# Patient Record
Sex: Female | Born: 1950 | Hispanic: No | Marital: Married | Smoking: Never smoker
Health system: Southern US, Community
[De-identification: ages and names within clinical notes are randomized; demographics above are authoritative.]

## PROBLEM LIST (undated history)

## (undated) DIAGNOSIS — M810 Age-related osteoporosis without current pathological fracture: Secondary | ICD-10-CM

## (undated) DIAGNOSIS — M8580 Other specified disorders of bone density and structure, unspecified site: Secondary | ICD-10-CM

## (undated) HISTORY — PX: RETINAL DETACHMENT SURGERY: SHX105

## (undated) HISTORY — DX: Age-related osteoporosis without current pathological fracture: M81.0

## (undated) HISTORY — PX: CATARACT EXTRACTION: SUR2

## (undated) HISTORY — DX: Other specified disorders of bone density and structure, unspecified site: M85.80

## (undated) HISTORY — PX: TUBAL LIGATION: SHX77

---

## 1998-04-18 ENCOUNTER — Other Ambulatory Visit: Admission: RE | Admit: 1998-04-18 | Discharge: 1998-04-18 | Payer: Self-pay | Admitting: Gynecology

## 1999-04-20 ENCOUNTER — Other Ambulatory Visit: Admission: RE | Admit: 1999-04-20 | Discharge: 1999-04-20 | Payer: Self-pay | Admitting: Gynecology

## 2000-06-06 ENCOUNTER — Other Ambulatory Visit: Admission: RE | Admit: 2000-06-06 | Discharge: 2000-06-06 | Payer: Self-pay | Admitting: Gynecology

## 2001-08-26 ENCOUNTER — Other Ambulatory Visit: Admission: RE | Admit: 2001-08-26 | Discharge: 2001-08-26 | Payer: Self-pay | Admitting: Gynecology

## 2002-11-26 ENCOUNTER — Other Ambulatory Visit: Admission: RE | Admit: 2002-11-26 | Discharge: 2002-11-26 | Payer: Self-pay | Admitting: Gynecology

## 2003-01-07 ENCOUNTER — Other Ambulatory Visit: Admission: RE | Admit: 2003-01-07 | Discharge: 2003-01-07 | Payer: Self-pay | Admitting: Obstetrics and Gynecology

## 2004-02-28 ENCOUNTER — Other Ambulatory Visit: Admission: RE | Admit: 2004-02-28 | Discharge: 2004-02-28 | Payer: Self-pay | Admitting: Gynecology

## 2005-03-09 ENCOUNTER — Other Ambulatory Visit: Admission: RE | Admit: 2005-03-09 | Discharge: 2005-03-09 | Payer: Self-pay | Admitting: Gynecology

## 2005-04-06 ENCOUNTER — Ambulatory Visit: Payer: Self-pay | Admitting: Internal Medicine

## 2005-04-24 ENCOUNTER — Encounter (INDEPENDENT_AMBULATORY_CARE_PROVIDER_SITE_OTHER): Payer: Self-pay | Admitting: Specialist

## 2005-04-24 ENCOUNTER — Ambulatory Visit: Payer: Self-pay | Admitting: Internal Medicine

## 2005-06-26 ENCOUNTER — Ambulatory Visit: Payer: Self-pay | Admitting: Internal Medicine

## 2005-09-11 ENCOUNTER — Ambulatory Visit: Payer: Self-pay | Admitting: Internal Medicine

## 2010-03-22 ENCOUNTER — Encounter (INDEPENDENT_AMBULATORY_CARE_PROVIDER_SITE_OTHER): Payer: Self-pay | Admitting: *Deleted

## 2010-05-05 ENCOUNTER — Encounter: Admission: RE | Admit: 2010-05-05 | Discharge: 2010-05-05 | Payer: Self-pay | Admitting: Gynecology

## 2010-10-17 NOTE — Letter (Signed)
Summary: Colonoscopy Letter  [facility]  [address] [place], [place] [address]   Phone: [phone]  Fax: [phone]      March 22, 2010 MRN: [id number]   Lindsey Patton [address] [place], [place]  [address]   Dear Ms. Peral,   According to your medical record, it is time for you to schedule a Colonoscopy. The American Cancer Society recommends this procedure as a method to detect early colon cancer. Patients with a family history of colon cancer, or a personal history of colon polyps or inflammatory bowel disease are at increased risk.  This letter has beeen generated based on the recommendations made at the time of your procedure. If you feel that in your particular situation this may no longer apply, please contact our office.  Please call our office at [phone] to schedule this appointment or to update your records at your earliest convenience.  Thank you for cooperating with [place] to provide you with the very best care possible.   Sincerely,  Hedwig Morton. Juanda Chance, M.D.  [facility] [phone]

## 2010-12-05 ENCOUNTER — Other Ambulatory Visit: Payer: Self-pay | Admitting: Gynecology

## 2010-12-05 DIAGNOSIS — Z09 Encounter for follow-up examination after completed treatment for conditions other than malignant neoplasm: Secondary | ICD-10-CM

## 2010-12-25 ENCOUNTER — Ambulatory Visit
Admission: RE | Admit: 2010-12-25 | Discharge: 2010-12-25 | Disposition: A | Discharge status: Home / Self Care | Payer: BC Managed Care – PPO | Source: Ambulatory Visit | Attending: Gynecology | Admitting: Gynecology

## 2010-12-25 DIAGNOSIS — Z09 Encounter for follow-up examination after completed treatment for conditions other than malignant neoplasm: Secondary | ICD-10-CM

## 2011-02-26 ENCOUNTER — Encounter: Payer: Self-pay | Admitting: Internal Medicine

## 2011-02-26 NOTE — Progress Notes (Signed)
Patient has changed GI care to Dr Jennye Boroughs at [facility].

## 2011-08-02 ENCOUNTER — Other Ambulatory Visit: Payer: Self-pay | Admitting: Gynecology

## 2011-08-02 DIAGNOSIS — Z1231 Encounter for screening mammogram for malignant neoplasm of breast: Secondary | ICD-10-CM

## 2011-08-24 ENCOUNTER — Ambulatory Visit
Admission: RE | Admit: 2011-08-24 | Discharge: 2011-08-24 | Disposition: A | Discharge status: Home / Self Care | Payer: BC Managed Care – PPO | Source: Ambulatory Visit | Attending: Gynecology | Admitting: Gynecology

## 2011-08-24 DIAGNOSIS — Z1231 Encounter for screening mammogram for malignant neoplasm of breast: Secondary | ICD-10-CM

## 2011-10-12 ENCOUNTER — Other Ambulatory Visit: Payer: Self-pay | Admitting: Gynecology

## 2011-10-12 DIAGNOSIS — R928 Other abnormal and inconclusive findings on diagnostic imaging of breast: Secondary | ICD-10-CM

## 2011-10-22 ENCOUNTER — Ambulatory Visit
Admission: RE | Admit: 2011-10-22 | Discharge: 2011-10-22 | Disposition: A | Discharge status: Home / Self Care | Payer: BC Managed Care – PPO | Source: Ambulatory Visit | Attending: Gynecology | Admitting: Gynecology

## 2011-10-22 DIAGNOSIS — R928 Other abnormal and inconclusive findings on diagnostic imaging of breast: Secondary | ICD-10-CM

## 2013-01-29 ENCOUNTER — Other Ambulatory Visit: Payer: Self-pay

## 2013-01-29 DIAGNOSIS — Z1231 Encounter for screening mammogram for malignant neoplasm of breast: Secondary | ICD-10-CM

## 2013-03-04 ENCOUNTER — Ambulatory Visit
Admission: RE | Admit: 2013-03-04 | Discharge: 2013-03-04 | Disposition: A | Discharge status: Home / Self Care | Payer: BC Managed Care – PPO | Source: Ambulatory Visit

## 2013-03-04 DIAGNOSIS — Z1231 Encounter for screening mammogram for malignant neoplasm of breast: Secondary | ICD-10-CM

## 2014-03-03 ENCOUNTER — Other Ambulatory Visit: Payer: Self-pay

## 2014-03-03 DIAGNOSIS — Z1231 Encounter for screening mammogram for malignant neoplasm of breast: Secondary | ICD-10-CM

## 2014-03-22 ENCOUNTER — Encounter (INDEPENDENT_AMBULATORY_CARE_PROVIDER_SITE_OTHER): Payer: Self-pay

## 2014-03-22 ENCOUNTER — Ambulatory Visit
Admission: RE | Admit: 2014-03-22 | Discharge: 2014-03-22 | Disposition: A | Discharge status: Home / Self Care | Payer: BC Managed Care – PPO | Source: Ambulatory Visit

## 2014-03-22 DIAGNOSIS — Z1231 Encounter for screening mammogram for malignant neoplasm of breast: Secondary | ICD-10-CM

## 2014-05-31 ENCOUNTER — Other Ambulatory Visit: Payer: Self-pay | Admitting: Gynecology

## 2014-06-01 LAB — CYTOLOGY - PAP

## 2015-06-12 IMAGING — MG MM DIGITAL SCREENING BILAT W/ CAD
5 series · 5 of 5 positions shown · non-contrast
Comparison: Previous exam(s).

CLINICAL DATA: Screening.

EXAM:
DIGITAL SCREENING BILATERAL MAMMOGRAM WITH CAD

[R CC]
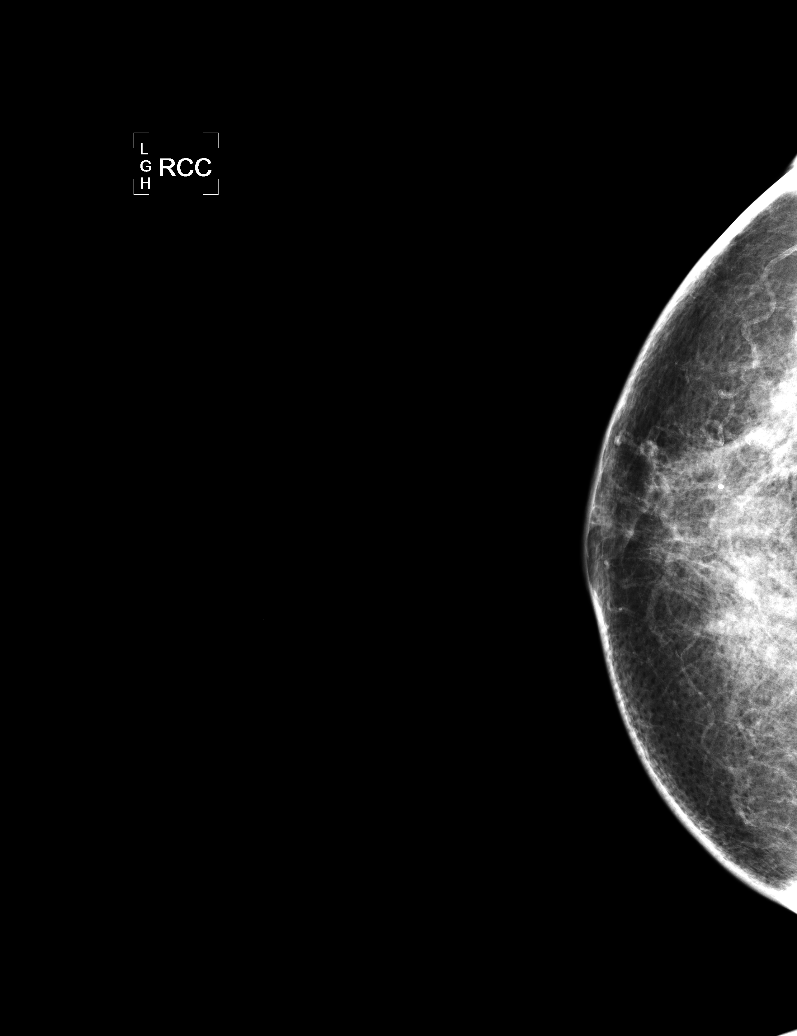

[L CC]
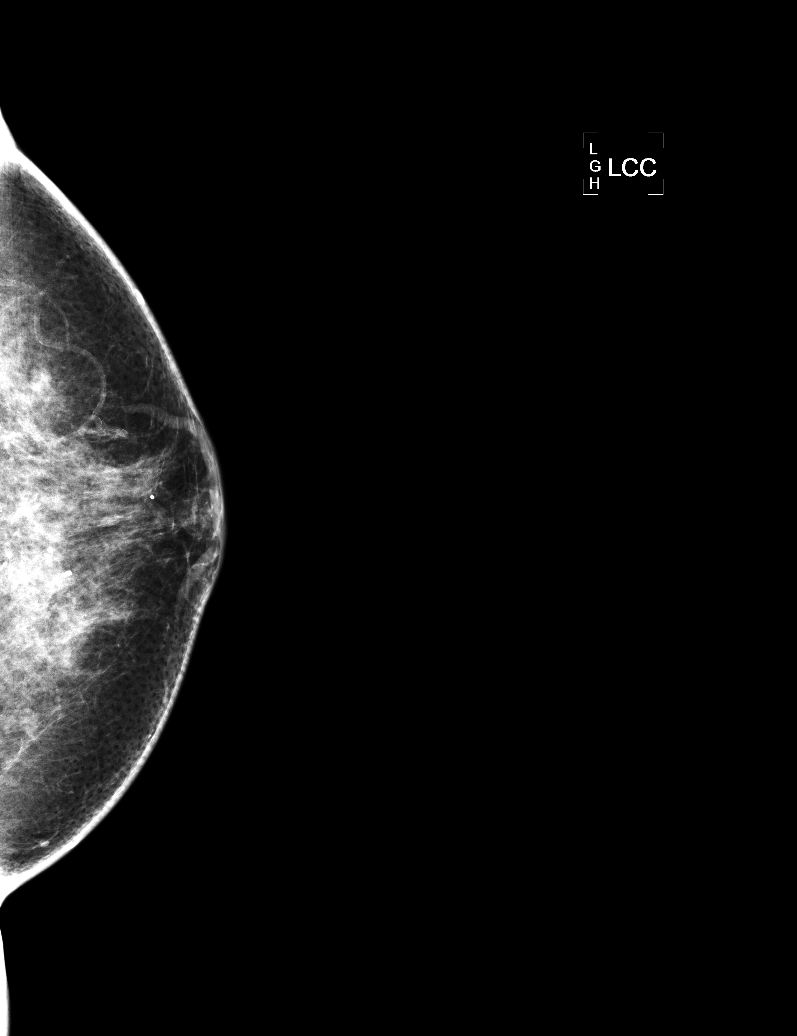

[L MLO]
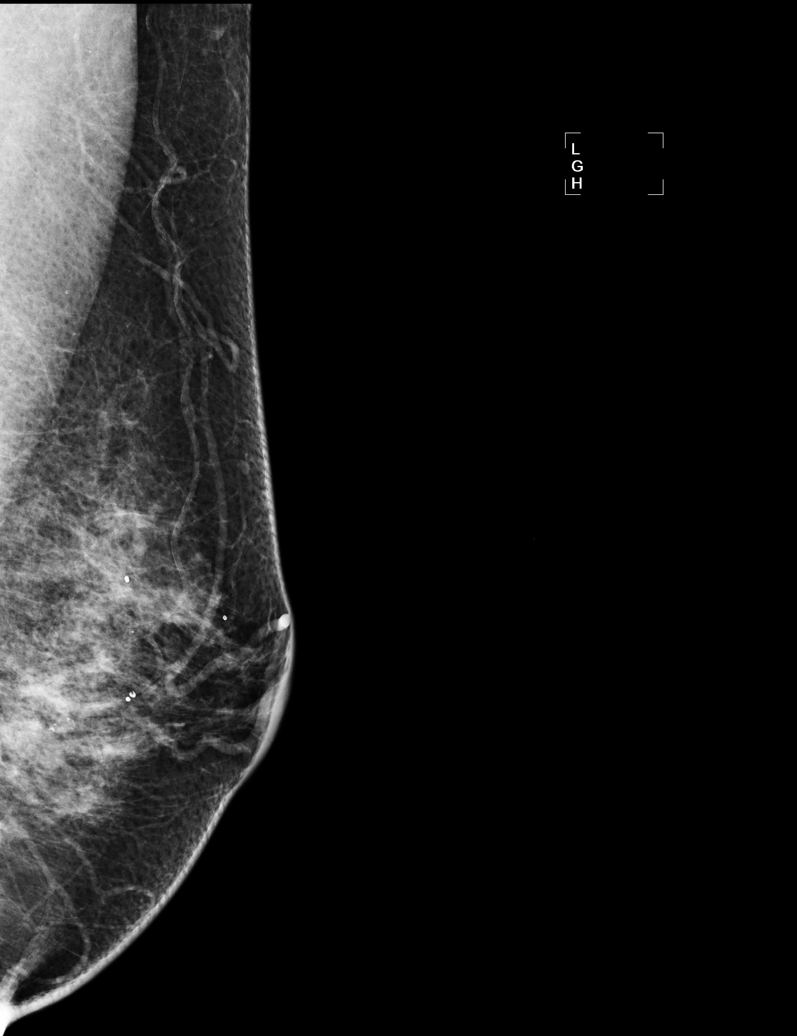

[R MLO (1 of 2)]
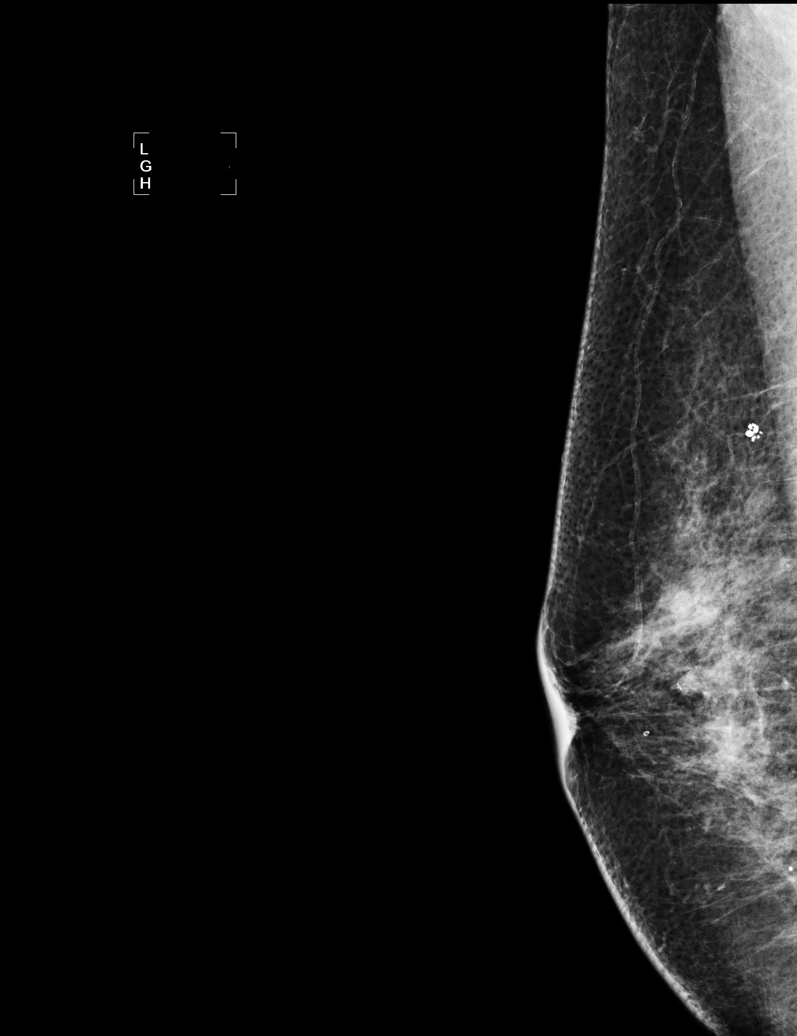

[R MLO (2 of 2)]
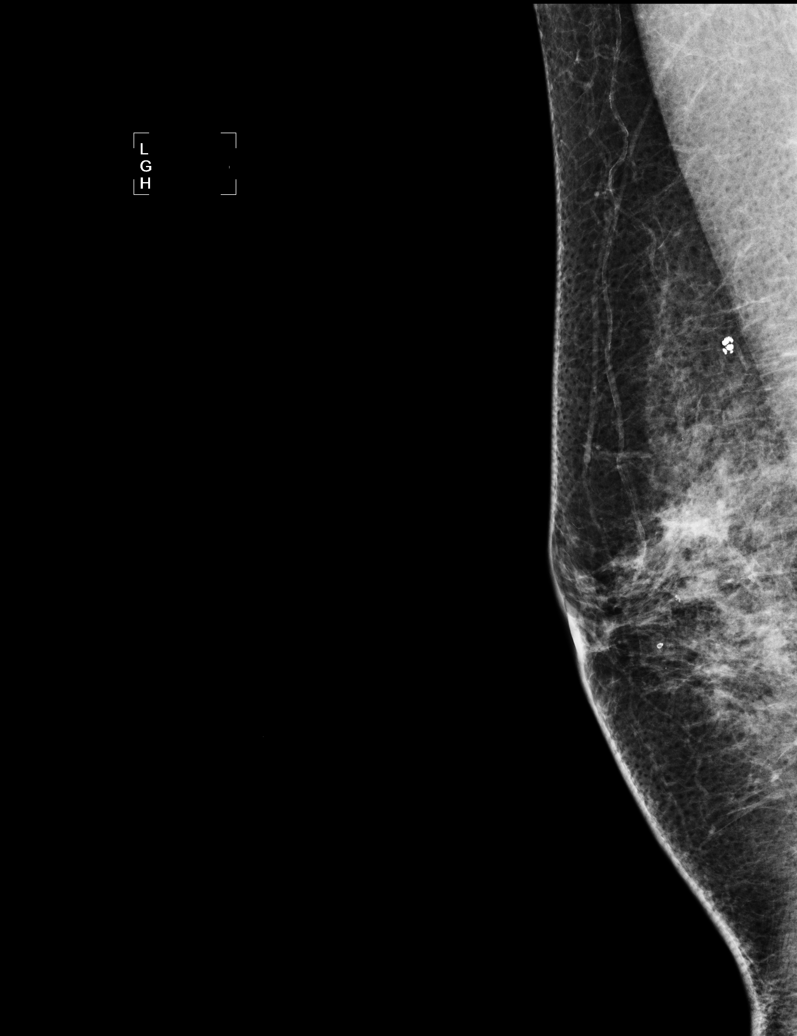

[5 of 5 positions shown; findings below may reference images not displayed]

ACR Breast Density Category c: The breast tissue is heterogeneously
dense, which may obscure small masses.
FINDINGS: There are no findings suspicious for malignancy. Images were
processed with CAD.
IMPRESSION: No mammographic evidence of malignancy. A result letter of this
screening mammogram will be mailed directly to the patient.

RECOMMENDATION:
Screening mammogram in one year. (Code:YJ-2-FEZ)

BI-RADS CATEGORY  1: Negative.

## 2019-09-21 DIAGNOSIS — Z1231 Encounter for screening mammogram for malignant neoplasm of breast: Secondary | ICD-10-CM | POA: Diagnosis not present

## 2020-04-05 DIAGNOSIS — D2239 Melanocytic nevi of other parts of face: Secondary | ICD-10-CM | POA: Diagnosis not present

## 2020-04-05 DIAGNOSIS — D225 Melanocytic nevi of trunk: Secondary | ICD-10-CM | POA: Diagnosis not present

## 2020-04-05 DIAGNOSIS — D1801 Hemangioma of skin and subcutaneous tissue: Secondary | ICD-10-CM | POA: Diagnosis not present

## 2020-04-05 DIAGNOSIS — D485 Neoplasm of uncertain behavior of skin: Secondary | ICD-10-CM | POA: Diagnosis not present

## 2020-04-05 DIAGNOSIS — L578 Other skin changes due to chronic exposure to nonionizing radiation: Secondary | ICD-10-CM | POA: Diagnosis not present

## 2020-04-19 DIAGNOSIS — L570 Actinic keratosis: Secondary | ICD-10-CM | POA: Diagnosis not present

## 2020-06-03 DIAGNOSIS — Z6824 Body mass index (BMI) 24.0-24.9, adult: Secondary | ICD-10-CM | POA: Diagnosis not present

## 2020-06-03 DIAGNOSIS — M25511 Pain in right shoulder: Secondary | ICD-10-CM | POA: Diagnosis not present

## 2020-06-10 DIAGNOSIS — M7551 Bursitis of right shoulder: Secondary | ICD-10-CM | POA: Diagnosis not present

## 2020-06-10 DIAGNOSIS — Z6824 Body mass index (BMI) 24.0-24.9, adult: Secondary | ICD-10-CM | POA: Diagnosis not present

## 2020-06-10 DIAGNOSIS — M75101 Unspecified rotator cuff tear or rupture of right shoulder, not specified as traumatic: Secondary | ICD-10-CM | POA: Diagnosis not present

## 2020-06-29 DIAGNOSIS — E559 Vitamin D deficiency, unspecified: Secondary | ICD-10-CM | POA: Diagnosis not present

## 2020-06-29 DIAGNOSIS — M8580 Other specified disorders of bone density and structure, unspecified site: Secondary | ICD-10-CM | POA: Diagnosis not present

## 2020-06-29 DIAGNOSIS — Z6823 Body mass index (BMI) 23.0-23.9, adult: Secondary | ICD-10-CM | POA: Diagnosis not present

## 2020-06-29 DIAGNOSIS — Z9181 History of falling: Secondary | ICD-10-CM | POA: Diagnosis not present

## 2020-06-29 DIAGNOSIS — Z23 Encounter for immunization: Secondary | ICD-10-CM | POA: Diagnosis not present

## 2020-06-29 DIAGNOSIS — Z0000 Encounter for general adult medical examination without abnormal findings: Secondary | ICD-10-CM | POA: Diagnosis not present

## 2020-06-29 DIAGNOSIS — E789 Disorder of lipoprotein metabolism, unspecified: Secondary | ICD-10-CM | POA: Diagnosis not present

## 2020-06-29 DIAGNOSIS — Z79899 Other long term (current) drug therapy: Secondary | ICD-10-CM | POA: Diagnosis not present

## 2020-06-29 DIAGNOSIS — Z1331 Encounter for screening for depression: Secondary | ICD-10-CM | POA: Diagnosis not present

## 2020-06-30 DIAGNOSIS — Z23 Encounter for immunization: Secondary | ICD-10-CM | POA: Diagnosis not present

## 2020-10-12 DIAGNOSIS — R091 Pleurisy: Secondary | ICD-10-CM | POA: Diagnosis not present

## 2020-10-12 DIAGNOSIS — Z20828 Contact with and (suspected) exposure to other viral communicable diseases: Secondary | ICD-10-CM | POA: Diagnosis not present

## 2020-11-01 DIAGNOSIS — Z1231 Encounter for screening mammogram for malignant neoplasm of breast: Secondary | ICD-10-CM | POA: Diagnosis not present

## 2021-01-17 DIAGNOSIS — R509 Fever, unspecified: Secondary | ICD-10-CM | POA: Diagnosis not present

## 2021-01-18 DIAGNOSIS — J329 Chronic sinusitis, unspecified: Secondary | ICD-10-CM | POA: Diagnosis not present

## 2021-01-18 DIAGNOSIS — J40 Bronchitis, not specified as acute or chronic: Secondary | ICD-10-CM | POA: Diagnosis not present

## 2021-04-27 DIAGNOSIS — H52221 Regular astigmatism, right eye: Secondary | ICD-10-CM | POA: Diagnosis not present

## 2021-04-27 DIAGNOSIS — Z9841 Cataract extraction status, right eye: Secondary | ICD-10-CM | POA: Diagnosis not present

## 2021-04-27 DIAGNOSIS — H5211 Myopia, right eye: Secondary | ICD-10-CM | POA: Diagnosis not present

## 2021-04-27 DIAGNOSIS — H25811 Combined forms of age-related cataract, right eye: Secondary | ICD-10-CM | POA: Diagnosis not present

## 2021-04-27 DIAGNOSIS — Z961 Presence of intraocular lens: Secondary | ICD-10-CM | POA: Diagnosis not present

## 2021-04-27 DIAGNOSIS — H2511 Age-related nuclear cataract, right eye: Secondary | ICD-10-CM | POA: Diagnosis not present

## 2021-05-12 DIAGNOSIS — Z111 Encounter for screening for respiratory tuberculosis: Secondary | ICD-10-CM | POA: Diagnosis not present

## 2021-07-11 DIAGNOSIS — M25562 Pain in left knee: Secondary | ICD-10-CM | POA: Diagnosis not present

## 2021-07-11 DIAGNOSIS — M25462 Effusion, left knee: Secondary | ICD-10-CM | POA: Diagnosis not present

## 2021-08-04 DIAGNOSIS — M25562 Pain in left knee: Secondary | ICD-10-CM | POA: Diagnosis not present

## 2021-08-09 DIAGNOSIS — M25562 Pain in left knee: Secondary | ICD-10-CM | POA: Diagnosis not present

## 2021-08-09 DIAGNOSIS — M25462 Effusion, left knee: Secondary | ICD-10-CM | POA: Diagnosis not present

## 2021-09-15 DIAGNOSIS — E789 Disorder of lipoprotein metabolism, unspecified: Secondary | ICD-10-CM | POA: Diagnosis not present

## 2021-09-15 DIAGNOSIS — Z1331 Encounter for screening for depression: Secondary | ICD-10-CM | POA: Diagnosis not present

## 2021-09-15 DIAGNOSIS — Z79899 Other long term (current) drug therapy: Secondary | ICD-10-CM | POA: Diagnosis not present

## 2021-09-15 DIAGNOSIS — Z6823 Body mass index (BMI) 23.0-23.9, adult: Secondary | ICD-10-CM | POA: Diagnosis not present

## 2021-09-15 DIAGNOSIS — Z0000 Encounter for general adult medical examination without abnormal findings: Secondary | ICD-10-CM | POA: Diagnosis not present

## 2021-09-15 DIAGNOSIS — E559 Vitamin D deficiency, unspecified: Secondary | ICD-10-CM | POA: Diagnosis not present

## 2021-11-03 DIAGNOSIS — Z6824 Body mass index (BMI) 24.0-24.9, adult: Secondary | ICD-10-CM | POA: Diagnosis not present

## 2021-11-03 DIAGNOSIS — N6002 Solitary cyst of left breast: Secondary | ICD-10-CM | POA: Diagnosis not present

## 2021-11-09 DIAGNOSIS — N6012 Diffuse cystic mastopathy of left breast: Secondary | ICD-10-CM | POA: Diagnosis not present

## 2021-11-09 DIAGNOSIS — N6002 Solitary cyst of left breast: Secondary | ICD-10-CM | POA: Diagnosis not present

## 2021-11-09 DIAGNOSIS — R922 Inconclusive mammogram: Secondary | ICD-10-CM | POA: Diagnosis not present

## 2021-11-15 ENCOUNTER — Encounter: Payer: Self-pay | Admitting: Cardiology

## 2021-11-15 ENCOUNTER — Ambulatory Visit: Payer: Medicare PPO | Admitting: Cardiology

## 2021-11-15 ENCOUNTER — Other Ambulatory Visit: Payer: Self-pay

## 2021-11-15 DIAGNOSIS — Z8249 Family history of ischemic heart disease and other diseases of the circulatory system: Secondary | ICD-10-CM | POA: Diagnosis not present

## 2021-11-15 DIAGNOSIS — E785 Hyperlipidemia, unspecified: Secondary | ICD-10-CM

## 2021-11-15 DIAGNOSIS — R0609 Other forms of dyspnea: Secondary | ICD-10-CM | POA: Diagnosis not present

## 2021-11-15 HISTORY — DX: Other forms of dyspnea: R06.09

## 2021-11-15 HISTORY — DX: Hyperlipidemia, unspecified: E78.5

## 2021-11-15 HISTORY — DX: Family history of ischemic heart disease and other diseases of the circulatory system: Z82.49

## 2021-11-15 NOTE — Addendum Note (Signed)
Addended by: Edwyna Shell I on: 11/15/2021 03:45 PM ? ? Modules accepted: Orders ? ?

## 2021-11-15 NOTE — Progress Notes (Signed)
? ?Cardiology Consultation:   ? ?Date:  11/15/2021  ? ?ID:  Lindsey Patton, DOB 1951/05/06, MRN [id number] ? ?PCP:  Ronita Hipps, MD  ?Cardiologist:  Jenne Campus, MD  ? ?Referring MD: No ref. provider found  ? ?Chief Complaint  ?Patient presents with  ? Establish Care  ? ? ?History of Present Illness:   ? ?Lindsey Patton is a 71 y.o. female who is being seen today for the evaluation of I would like to make sure I am fine from cardiac standpoint reviewed.  At the request of No ref. provider found.  Her past medical history significant for dyslipidemia treated with Zetia only, dyspnea on exertion.  Overall she is doing quite well however her mother is my patient and she does have a lot of cardiac conditions Lindsey Patton would like to be established as a patient I will make sure everything is fine with her.  She denies having any typical cardiac symptoms namely she does not have chest pain tightness squeezing pressure burning chest no palpitations dizziness swelling of lower extremities.  He is trying to be active she goes to gym 3 times a week she does water aerobic and she is enjoying this tremendously.  There is no swelling of lower extremities.  She tells me that she snores a lot but no stop breathing at night.  She does have history of dyslipidemia she was offered statin however she refused to take it because of her mother having some difficulty with the medication.  She is on Zetia only her LDL right now is still slightly elevated.  I 135 ?She never smoke she is not on any special diet ? ?History reviewed. No pertinent past medical history. ? ?Past Surgical History:  ?Procedure Laterality Date  ? CATARACT EXTRACTION Bilateral   ? RETINAL DETACHMENT SURGERY Bilateral   ? TUBAL LIGATION    ? ? ?Current Medications: ?Current Meds  ?Medication Sig  ? calcium citrate (CALCITRATE - DOSED IN MG ELEMENTAL CALCIUM) 950 (200 Ca) MG tablet Take 200 mg of elemental calcium by mouth daily.  ? Cholecalciferol (VITAMIN D3 PO)  Take 30 mcg by mouth daily.  ? ezetimibe (ZETIA) 10 MG tablet Take 10 mg by mouth daily.  ? magnesium 30 MG tablet Take 30 mg by mouth daily.  ? Omega-3 Fatty Acids (OMEGA-3 FISH OIL PO) Take 1 capsule by mouth daily.  ?  ? ?Allergies:   Patient has no known allergies.  ? ?Social History  ? ?Socioeconomic History  ? Marital status: Married  ?  Spouse name: Not on file  ? Number of children: Not on file  ? Years of education: Not on file  ? Highest education level: Not on file  ?Occupational History  ? Not on file  ?Tobacco Use  ? Smoking status: Never  ? Smokeless tobacco: Never  ?Substance and Sexual Activity  ? Alcohol use: Never  ? Drug use: Never  ? Sexual activity: Not Currently  ?Other Topics Concern  ? Not on file  ?Social History Narrative  ? Not on file  ? ?Social Determinants of Health  ? ?Financial Resource Strain: Not on file  ?Food Insecurity: Not on file  ?Transportation Needs: Not on file  ?Physical Activity: Not on file  ?Stress: Not on file  ?Social Connections: Not on file  ?  ? ?Family History: ?The patient's family history includes Heart failure in her maternal grandfather and mother. ?ROS:   ?Please see the history of present illness.    ?  All 14 point review of systems negative except as described per history of present illness. ? ?EKGs/Labs/Other Studies Reviewed:   ? ?The following studies were reviewed today: ? ? ?EKG:  EKG is  ordered today.  The ekg ordered today demonstrates normal sinus rhythm normal P interval normal QS complex duration fulgent no ST segment changes ? ?Recent Labs: ?No results found for requested labs within last 8760 hours.  ?Recent Lipid Panel ?No results found for: CHOL, TRIG, HDL, CHOLHDL, VLDL, LDLCALC, LDLDIRECT ? ?Physical Exam:   ? ?VS:  BP 128/80 (BP Location: Right Arm, Patient Position: Sitting)   Pulse 65   Ht 5\' 6"  (1.676 m)   Wt 152 lb 3.2 oz (69 kg)   SpO2 95%   BMI 24.57 kg/m?    ? ?Wt Readings from Last 3 Encounters:  ?11/15/21 152 lb 3.2 oz (69 kg)   ?  ? ?GEN:  Well nourished, well developed in no acute distress ?HEENT: Normal ?NECK: No JVD; No carotid bruits ?LYMPHATICS: No lymphadenopathy ?CARDIAC: RRR, no murmurs, no rubs, no gallops ?RESPIRATORY:  Clear to auscultation without rales, wheezing or rhonchi  ?ABDOMEN: Soft, non-tender, non-distended ?MUSCULOSKELETAL:  No edema; No deformity  ?SKIN: Warm and dry ?NEUROLOGIC:  Alert and oriented x 3 ?PSYCHIATRIC:  Normal affect  ? ?ASSESSMENT:   ? ?1. Dyspnea on exertion   ?2. Dyslipidemia   ?3. Family history of coronary artery disease   ? ?PLAN:   ? ?In order of problems listed above: ? ?Cardiovascular evaluation for this lady with family history of premature coronary artery disease and advanced coronary artery disease.  Overall she is doing very well clinically.  I think the key will be to assess her risk factors and properly modify dose.  I do not think we need to do any ischemia work-up she is able to exercise on the regular basis with no difficulties.  I did calculate her 10 years predicted risk for having heart problem which is 8.8.  This is intermediate.  She is already taking Zetia not on statin.  I recommended to go to calcium score to assess better her risk factors for future based on that we will decide how aggressive we need to be with management of her cholesterol also at that time we will decide if you need to be on aspirin.  We did talk in length about healthy lifestyle need to exercise on the regular basis good diet and I discussed with her basic of Mediterranean diet.  Overall I think she is doing very well.  And she will do well.  Calcium score will be done I will see her back 6 months ? ? ?Medication Adjustments/Labs and Tests Ordered: ?Current medicines are reviewed at length with the patient today.  Concerns regarding medicines are outlined above.  ?No orders of the defined types were placed in this encounter. ? ?No orders of the defined types were placed in this  encounter. ? ? ?Signed, ?Park Liter, MD, [facility]. ?11/15/2021 3:28 PM    ?[place] ? ?

## 2021-11-15 NOTE — Patient Instructions (Signed)
Medication Instructions:  ?Your physician recommends that you continue on your current medications as directed. Please refer to the Current Medication list given to you today. ? ?*If you need a refill on your cardiac medications before your next appointment, please call your pharmacy* ? ? ?Lab Work: ?None ?If you have labs (blood work) drawn today and your tests are completely normal, you will receive your results only by: ?MyChart Message (if you have MyChart) OR ?A paper copy in the mail ?If you have any lab test that is abnormal or we need to change your treatment, we will call you to review the results. ? ? ?Testing/Procedures: ?Marland KitchenWe will order CT coronary calcium score. ?It will cost $99.00 and is not covered by insurance.  ?Please call [phone] to schedule.   ?[facility]  ?[address]  ?[place], [place] [address]  ? ? ?Follow-Up: ?At [facility], you and your health needs are our priority.  As part of our continuing mission to provide you with exceptional heart care, we have created designated Provider Care Teams.  These Care Teams include your primary Cardiologist (physician) and Advanced Practice Providers (APPs -  Physician Assistants and Nurse Practitioners) who all work together to provide you with the care you need, when you need it. ? ?We recommend signing up for the patient portal called "MyChart".  Sign up information is provided on this After Visit Summary.  MyChart is used to connect with patients for Virtual Visits (Telemedicine).  Patients are able to view lab/test results, encounter notes, upcoming appointments, etc.  Non-urgent messages can be sent to your provider as well.   ?To learn more about what you can do with MyChart, go to [email or web address].   ? ?Your next appointment:   ?6 month(s) ? ?The format for your next appointment:   ?In Person ? ?Provider:   ?Gypsy Balsam, MD  ? ? ?Other Instructions ?None ? ?

## 2021-11-20 DIAGNOSIS — N6011 Diffuse cystic mastopathy of right breast: Secondary | ICD-10-CM | POA: Diagnosis not present

## 2021-11-20 DIAGNOSIS — N6002 Solitary cyst of left breast: Secondary | ICD-10-CM | POA: Diagnosis not present

## 2021-11-20 DIAGNOSIS — R928 Other abnormal and inconclusive findings on diagnostic imaging of breast: Secondary | ICD-10-CM | POA: Diagnosis not present

## 2021-11-21 ENCOUNTER — Other Ambulatory Visit: Payer: Self-pay

## 2021-11-21 ENCOUNTER — Ambulatory Visit (HOSPITAL_COMMUNITY)
Admission: RE | Admit: 2021-11-21 | Discharge: 2021-11-21 | Disposition: A | Discharge status: Home / Self Care | Payer: Self-pay | Source: Ambulatory Visit | Attending: Cardiology | Admitting: Cardiology

## 2021-11-21 DIAGNOSIS — R0609 Other forms of dyspnea: Secondary | ICD-10-CM | POA: Insufficient documentation

## 2021-11-21 DIAGNOSIS — E785 Hyperlipidemia, unspecified: Secondary | ICD-10-CM | POA: Insufficient documentation

## 2021-11-21 DIAGNOSIS — Z8249 Family history of ischemic heart disease and other diseases of the circulatory system: Secondary | ICD-10-CM | POA: Insufficient documentation

## 2021-11-27 DIAGNOSIS — S61219A Laceration without foreign body of unspecified finger without damage to nail, initial encounter: Secondary | ICD-10-CM | POA: Diagnosis not present

## 2022-01-29 DIAGNOSIS — J302 Other seasonal allergic rhinitis: Secondary | ICD-10-CM | POA: Diagnosis not present

## 2022-01-29 DIAGNOSIS — H6121 Impacted cerumen, right ear: Secondary | ICD-10-CM | POA: Diagnosis not present

## 2022-01-29 DIAGNOSIS — Z6824 Body mass index (BMI) 24.0-24.9, adult: Secondary | ICD-10-CM | POA: Diagnosis not present

## 2022-05-28 ENCOUNTER — Ambulatory Visit: Payer: Medicare PPO | Attending: Cardiology | Admitting: Cardiology

## 2022-05-28 ENCOUNTER — Encounter: Payer: Self-pay | Admitting: Cardiology

## 2022-05-28 VITALS — BP 118/72 | HR 71 | Ht 66.0 in | Wt 152.4 lb

## 2022-05-28 DIAGNOSIS — E785 Hyperlipidemia, unspecified: Secondary | ICD-10-CM | POA: Diagnosis not present

## 2022-05-28 DIAGNOSIS — R0609 Other forms of dyspnea: Secondary | ICD-10-CM | POA: Diagnosis not present

## 2022-05-28 DIAGNOSIS — Z8249 Family history of ischemic heart disease and other diseases of the circulatory system: Secondary | ICD-10-CM | POA: Diagnosis not present

## 2022-05-28 NOTE — Progress Notes (Signed)
Cardiology Office Note:    Date:  05/28/2022   ID:  Lindsey Patton, DOB 03-Nov-1950, MRN [id number]  PCP:  Marylen Ponto, MD  Cardiologist:  Gypsy Balsam, MD    Referring MD: Marylen Ponto, MD   Chief Complaint  Patient presents with   Follow-up  Doing very well  History of Present Illness:    Lindsey Patton is a 71 y.o. female with past medical history significant for dyslipidemia, she is only on Zetia, she is afraid to take statin, dyspnea exertion, family history of coronary artery disease. She is in my office today for follow-up.  Overall she is doing well.  Denies of any chest pain tightness squeezing pressure burning chest no palpitation dizziness swelling of lower extremities.  She did have a calcium score which came 0.  There was some nodule on the pulmonary portion of the test however radiologist is not concerned no follow-up needed.  Obviously she is very pleased with the results of her test.  History reviewed. No pertinent past medical history.  Past Surgical History:  Procedure Laterality Date   CATARACT EXTRACTION Bilateral    RETINAL DETACHMENT SURGERY Bilateral    TUBAL LIGATION      Current Medications: Current Meds  Medication Sig   calcium citrate (CALCITRATE - DOSED IN MG ELEMENTAL CALCIUM) 950 (200 Ca) MG tablet Take 200 mg of elemental calcium by mouth daily.   Cholecalciferol (VITAMIN D3 PO) Take 30 mcg by mouth daily.   ezetimibe (ZETIA) 10 MG tablet Take 10 mg by mouth daily.   magnesium 30 MG tablet Take 30 mg by mouth daily.   Omega-3 Fatty Acids (OMEGA-3 FISH OIL PO) Take 1 capsule by mouth daily.     Allergies:   Patient has no known allergies.   Social History   Socioeconomic History   Marital status: Married    Spouse name: Not on file   Number of children: Not on file   Years of education: Not on file   Highest education level: Not on file  Occupational History   Not on file  Tobacco Use   Smoking status: Never   Smokeless  tobacco: Never  Substance and Sexual Activity   Alcohol use: Never   Drug use: Never   Sexual activity: Not Currently  Other Topics Concern   Not on file  Social History Narrative   Not on file   Social Determinants of Health   Financial Resource Strain: Not on file  Food Insecurity: Not on file  Transportation Needs: Not on file  Physical Activity: Not on file  Stress: Not on file  Social Connections: Not on file     Family History: The patient's family history includes Heart failure in her maternal grandfather and mother. ROS:   Please see the history of present illness.    All 14 point review of systems negative except as described per history of present illness  EKGs/Labs/Other Studies Reviewed:      Recent Labs: No results found for requested labs within last 365 days.  Recent Lipid Panel No results found for: "CHOL", "TRIG", "HDL", "CHOLHDL", "VLDL", "LDLCALC", "LDLDIRECT"  Physical Exam:    VS:  BP 118/72 (BP Location: Left Arm, Patient Position: Sitting)   Pulse 71   Ht 5\' 6"  (1.676 m)   Wt 152 lb 6.4 oz (69.1 kg)   SpO2 95%   BMI 24.60 kg/m     Wt Readings from Last 3 Encounters:  05/28/22 152 lb 6.4  oz (69.1 kg)  11/15/21 152 lb 3.2 oz (69 kg)     GEN:  Well nourished, well developed in no acute distress HEENT: Normal NECK: No JVD; No carotid bruits LYMPHATICS: No lymphadenopathy CARDIAC: RRR, no murmurs, no rubs, no gallops RESPIRATORY:  Clear to auscultation without rales, wheezing or rhonchi  ABDOMEN: Soft, non-tender, non-distended MUSCULOSKELETAL:  No edema; No deformity  SKIN: Warm and dry LOWER EXTREMITIES: no swelling NEUROLOGIC:  Alert and oriented x 3 PSYCHIATRIC:  Normal affect   ASSESSMENT:    1. Dyslipidemia   2. Family history of coronary artery disease   3. Dyspnea on exertion    PLAN:    In order of problems listed above:  Dyslipidemia.  She is intermediate risk category she is already on Zetia.  Calcium score 0.  No  need to initiate statin. Family history of coronary artery disease.  We discussed at length what to do with the situation we discussed need to exercise on the regular basis which she already does as well as good diet I discussed basic of Mediterranean diet. Dyspnea exertion: Doing well continue present management   Medication Adjustments/Labs and Tests Ordered: Current medicines are reviewed at length with the patient today.  Concerns regarding medicines are outlined above.  No orders of the defined types were placed in this encounter.  Medication changes: No orders of the defined types were placed in this encounter.   Signed, Georgeanna Lea, MD, [facility] 05/28/2022 1:24 PM    [facility]

## 2022-05-28 NOTE — Patient Instructions (Signed)

## 2022-08-21 DIAGNOSIS — H5201 Hypermetropia, right eye: Secondary | ICD-10-CM | POA: Diagnosis not present

## 2022-08-21 DIAGNOSIS — H5212 Myopia, left eye: Secondary | ICD-10-CM | POA: Diagnosis not present

## 2022-08-21 DIAGNOSIS — Z961 Presence of intraocular lens: Secondary | ICD-10-CM | POA: Diagnosis not present

## 2022-08-21 DIAGNOSIS — H26491 Other secondary cataract, right eye: Secondary | ICD-10-CM | POA: Diagnosis not present

## 2022-08-21 DIAGNOSIS — H52223 Regular astigmatism, bilateral: Secondary | ICD-10-CM | POA: Diagnosis not present

## 2022-08-21 DIAGNOSIS — Z9842 Cataract extraction status, left eye: Secondary | ICD-10-CM | POA: Diagnosis not present

## 2022-10-31 DIAGNOSIS — Z23 Encounter for immunization: Secondary | ICD-10-CM | POA: Diagnosis not present

## 2022-10-31 DIAGNOSIS — E789 Disorder of lipoprotein metabolism, unspecified: Secondary | ICD-10-CM | POA: Diagnosis not present

## 2022-10-31 DIAGNOSIS — Z0000 Encounter for general adult medical examination without abnormal findings: Secondary | ICD-10-CM | POA: Diagnosis not present

## 2022-10-31 DIAGNOSIS — Z79899 Other long term (current) drug therapy: Secondary | ICD-10-CM | POA: Diagnosis not present

## 2022-10-31 DIAGNOSIS — M8580 Other specified disorders of bone density and structure, unspecified site: Secondary | ICD-10-CM | POA: Diagnosis not present

## 2022-10-31 DIAGNOSIS — Z6824 Body mass index (BMI) 24.0-24.9, adult: Secondary | ICD-10-CM | POA: Diagnosis not present

## 2022-10-31 DIAGNOSIS — Z1331 Encounter for screening for depression: Secondary | ICD-10-CM | POA: Diagnosis not present

## 2022-10-31 DIAGNOSIS — E559 Vitamin D deficiency, unspecified: Secondary | ICD-10-CM | POA: Diagnosis not present

## 2022-10-31 DIAGNOSIS — M8589 Other specified disorders of bone density and structure, multiple sites: Secondary | ICD-10-CM | POA: Diagnosis not present

## 2022-11-16 DIAGNOSIS — N6323 Unspecified lump in the left breast, lower outer quadrant: Secondary | ICD-10-CM | POA: Diagnosis not present

## 2022-11-16 DIAGNOSIS — N6002 Solitary cyst of left breast: Secondary | ICD-10-CM | POA: Diagnosis not present

## 2023-01-07 DIAGNOSIS — M545 Low back pain, unspecified: Secondary | ICD-10-CM | POA: Diagnosis not present

## 2023-01-07 DIAGNOSIS — Z6824 Body mass index (BMI) 24.0-24.9, adult: Secondary | ICD-10-CM | POA: Diagnosis not present

## 2023-03-20 DIAGNOSIS — M5459 Other low back pain: Secondary | ICD-10-CM | POA: Diagnosis not present

## 2023-03-27 DIAGNOSIS — M5459 Other low back pain: Secondary | ICD-10-CM | POA: Diagnosis not present

## 2023-04-04 DIAGNOSIS — M5459 Other low back pain: Secondary | ICD-10-CM | POA: Diagnosis not present

## 2023-04-18 DIAGNOSIS — M5459 Other low back pain: Secondary | ICD-10-CM | POA: Diagnosis not present

## 2023-04-22 DIAGNOSIS — M5459 Other low back pain: Secondary | ICD-10-CM | POA: Diagnosis not present

## 2023-04-29 DIAGNOSIS — M5459 Other low back pain: Secondary | ICD-10-CM | POA: Diagnosis not present

## 2023-05-13 DIAGNOSIS — M5459 Other low back pain: Secondary | ICD-10-CM | POA: Diagnosis not present

## 2023-05-15 DIAGNOSIS — L814 Other melanin hyperpigmentation: Secondary | ICD-10-CM | POA: Diagnosis not present

## 2023-05-15 DIAGNOSIS — D2239 Melanocytic nevi of other parts of face: Secondary | ICD-10-CM | POA: Diagnosis not present

## 2023-05-15 DIAGNOSIS — D225 Melanocytic nevi of trunk: Secondary | ICD-10-CM | POA: Diagnosis not present

## 2023-05-15 DIAGNOSIS — L821 Other seborrheic keratosis: Secondary | ICD-10-CM | POA: Diagnosis not present

## 2023-05-23 ENCOUNTER — Ambulatory Visit: Payer: Medicare PPO | Attending: Cardiology | Admitting: Cardiology

## 2023-05-23 ENCOUNTER — Encounter: Payer: Self-pay | Admitting: Cardiology

## 2023-05-23 VITALS — BP 126/70 | HR 67 | Ht 66.0 in | Wt 154.4 lb

## 2023-05-23 DIAGNOSIS — E785 Hyperlipidemia, unspecified: Secondary | ICD-10-CM

## 2023-05-23 DIAGNOSIS — R0609 Other forms of dyspnea: Secondary | ICD-10-CM

## 2023-05-23 DIAGNOSIS — Z8249 Family history of ischemic heart disease and other diseases of the circulatory system: Secondary | ICD-10-CM

## 2023-05-23 NOTE — Progress Notes (Signed)
Cardiology Office Note:    Date:  05/23/2023   ID:  DEMECIA Patton, DOB 1951/04/09, MRN [id number]  PCP:  Marylen Ponto, MD  Cardiologist:  Gypsy Balsam, MD    Referring MD: Marylen Ponto, MD   Chief Complaint  Patient presents with   Follow-up    History of Present Illness:    Lindsey OLIVERSON is a 72 y.o. female past medical history significant for dyslipidemia she is on Zetia only, she is afraid to take statin.  We did a calcium score which was 0, also dyspnea exertion, family history of coronary artery disease.  Comes today to months for follow-up.  Overall she doing very well.  Denies of any chest pain tightness squeezing pressure burning chest.  She was discovered to have osteoporosis and she is very worried about it she started exercising on the regular basis practically every single day she does water aerobics she also does of weightbearing exercises she is doing well asymptomatic  Past Medical History:  Diagnosis Date   Osteopenia    hip   Osteoporosis    Spine    Past Surgical History:  Procedure Laterality Date   CATARACT EXTRACTION Bilateral    RETINAL DETACHMENT SURGERY Bilateral    TUBAL LIGATION      Current Medications: Current Meds  Medication Sig   calcium citrate (CALCITRATE - DOSED IN MG ELEMENTAL CALCIUM) 950 (200 Ca) MG tablet Take 200 mg of elemental calcium by mouth daily.   ezetimibe (ZETIA) 10 MG tablet Take 10 mg by mouth daily.   OVER THE COUNTER MEDICATION Take 1 tablet by mouth daily. Native Path Bone Health Collagen Peptide   OVER THE COUNTER MEDICATION Take 1 tablet by mouth daily. Natural Option OsteOrgoni Cal Plus CK2 D3 Magnesium, Calcium Magnesium   [DISCONTINUED] Cholecalciferol (VITAMIN D3 PO) Take 30 mcg by mouth daily.   [DISCONTINUED] magnesium 30 MG tablet Take 30 mg by mouth daily.   [DISCONTINUED] Omega-3 Fatty Acids (OMEGA-3 FISH OIL PO) Take 1 capsule by mouth daily.     Allergies:   Patient has no known allergies.    Social History   Socioeconomic History   Marital status: Married    Spouse name: Not on file   Number of children: Not on file   Years of education: Not on file   Highest education level: Not on file  Occupational History   Not on file  Tobacco Use   Smoking status: Never   Smokeless tobacco: Never  Substance and Sexual Activity   Alcohol use: Never   Drug use: Never   Sexual activity: Not Currently  Other Topics Concern   Not on file  Social History Narrative   Not on file   Social Determinants of Health   Financial Resource Strain: Not on file  Food Insecurity: Not on file  Transportation Needs: Not on file  Physical Activity: Not on file  Stress: Not on file  Social Connections: Not on file     Family History: The patient's family history includes Heart failure in her maternal grandfather and mother. ROS:   Please see the history of present illness.    All 14 point review of systems negative except as described per history of present illness  EKGs/Labs/Other Studies Reviewed:         Recent Labs: No results found for requested labs within last 365 days.  Recent Lipid Panel No results found for: "CHOL", "TRIG", "HDL", "CHOLHDL", "VLDL", "LDLCALC", "LDLDIRECT"  Physical Exam:  VS:  BP 126/70 (BP Location: Left Arm, Patient Position: Sitting)   Pulse 67   Ht 5\' 6"  (1.676 m)   Wt 154 lb 6.4 oz (70 kg)   SpO2 95%   BMI 24.92 kg/m     Wt Readings from Last 3 Encounters:  05/23/23 154 lb 6.4 oz (70 kg)  05/28/22 152 lb 6.4 oz (69.1 kg)  11/15/21 152 lb 3.2 oz (69 kg)     GEN:  Well nourished, well developed in no acute distress HEENT: Normal NECK: No JVD; No carotid bruits LYMPHATICS: No lymphadenopathy CARDIAC: RRR, no murmurs, no rubs, no gallops RESPIRATORY:  Clear to auscultation without rales, wheezing or rhonchi  ABDOMEN: Soft, non-tender, non-distended MUSCULOSKELETAL:  No edema; No deformity  SKIN: Warm and dry LOWER EXTREMITIES: no  swelling NEUROLOGIC:  Alert and oriented x 3 PSYCHIATRIC:  Normal affect   ASSESSMENT:    1. Dyspnea on exertion   2. Dyslipidemia   3. Family history of coronary artery disease    PLAN:    In order of problems listed above:  Dyspnea exertion stable from that point with doing very well. Dyslipidemia.  I will call primary care physician to get her fasting lipid profile.  I do have total cholesterol 203 LDL HDL 44 but I do not have LDL.  Still she refused taking any statin likely last calcium score was 0. Family history of premature coronary artery disease.  Noted. Osteoporosis followed by antimedicine team.  She is afraid to take any medications for it but she changed her diet   Medication Adjustments/Labs and Tests Ordered: Current medicines are reviewed at length with the patient today.  Concerns regarding medicines are outlined above.  Orders Placed This Encounter  Procedures   EKG 12-Lead   Medication changes: No orders of the defined types were placed in this encounter.   Signed, Georgeanna Lea, MD, [facility] 05/23/2023 1:49 PM    [facility]

## 2023-05-23 NOTE — Patient Instructions (Signed)

## 2023-05-24 DIAGNOSIS — M5459 Other low back pain: Secondary | ICD-10-CM | POA: Diagnosis not present

## 2023-05-27 DIAGNOSIS — H1045 Other chronic allergic conjunctivitis: Secondary | ICD-10-CM | POA: Diagnosis not present

## 2023-05-27 DIAGNOSIS — H1031 Unspecified acute conjunctivitis, right eye: Secondary | ICD-10-CM | POA: Diagnosis not present

## 2023-05-31 DIAGNOSIS — M5459 Other low back pain: Secondary | ICD-10-CM | POA: Diagnosis not present

## 2023-06-07 DIAGNOSIS — M5459 Other low back pain: Secondary | ICD-10-CM | POA: Diagnosis not present

## 2023-08-26 DIAGNOSIS — Z961 Presence of intraocular lens: Secondary | ICD-10-CM | POA: Diagnosis not present

## 2023-08-26 DIAGNOSIS — Z9849 Cataract extraction status, unspecified eye: Secondary | ICD-10-CM | POA: Diagnosis not present

## 2023-08-26 DIAGNOSIS — H52223 Regular astigmatism, bilateral: Secondary | ICD-10-CM | POA: Diagnosis not present

## 2023-08-26 DIAGNOSIS — H5231 Anisometropia: Secondary | ICD-10-CM | POA: Diagnosis not present

## 2023-11-04 DIAGNOSIS — M8580 Other specified disorders of bone density and structure, unspecified site: Secondary | ICD-10-CM | POA: Diagnosis not present

## 2023-11-04 DIAGNOSIS — Z79899 Other long term (current) drug therapy: Secondary | ICD-10-CM | POA: Diagnosis not present

## 2023-11-04 DIAGNOSIS — E789 Disorder of lipoprotein metabolism, unspecified: Secondary | ICD-10-CM | POA: Diagnosis not present

## 2023-11-04 DIAGNOSIS — Z1331 Encounter for screening for depression: Secondary | ICD-10-CM | POA: Diagnosis not present

## 2023-11-04 DIAGNOSIS — Z1339 Encounter for screening examination for other mental health and behavioral disorders: Secondary | ICD-10-CM | POA: Diagnosis not present

## 2023-11-04 DIAGNOSIS — E559 Vitamin D deficiency, unspecified: Secondary | ICD-10-CM | POA: Diagnosis not present

## 2023-11-04 DIAGNOSIS — Z6825 Body mass index (BMI) 25.0-25.9, adult: Secondary | ICD-10-CM | POA: Diagnosis not present

## 2023-11-04 DIAGNOSIS — Z0000 Encounter for general adult medical examination without abnormal findings: Secondary | ICD-10-CM | POA: Diagnosis not present

## 2023-11-29 DIAGNOSIS — Z1231 Encounter for screening mammogram for malignant neoplasm of breast: Secondary | ICD-10-CM | POA: Diagnosis not present

## 2024-04-16 DIAGNOSIS — D225 Melanocytic nevi of trunk: Secondary | ICD-10-CM | POA: Diagnosis not present

## 2024-04-16 DIAGNOSIS — L821 Other seborrheic keratosis: Secondary | ICD-10-CM | POA: Diagnosis not present

## 2024-04-16 DIAGNOSIS — D2239 Melanocytic nevi of other parts of face: Secondary | ICD-10-CM | POA: Diagnosis not present

## 2024-04-16 DIAGNOSIS — L820 Inflamed seborrheic keratosis: Secondary | ICD-10-CM | POA: Diagnosis not present

## 2024-05-12 DIAGNOSIS — S90861A Insect bite (nonvenomous), right foot, initial encounter: Secondary | ICD-10-CM | POA: Diagnosis not present

## 2024-05-12 DIAGNOSIS — Z6825 Body mass index (BMI) 25.0-25.9, adult: Secondary | ICD-10-CM | POA: Diagnosis not present

## 2024-05-12 DIAGNOSIS — W57XXXA Bitten or stung by nonvenomous insect and other nonvenomous arthropods, initial encounter: Secondary | ICD-10-CM | POA: Diagnosis not present

## 2024-05-15 DIAGNOSIS — M8580 Other specified disorders of bone density and structure, unspecified site: Secondary | ICD-10-CM | POA: Insufficient documentation

## 2024-05-15 DIAGNOSIS — M810 Age-related osteoporosis without current pathological fracture: Secondary | ICD-10-CM | POA: Insufficient documentation

## 2024-05-19 ENCOUNTER — Ambulatory Visit: Attending: Cardiology | Admitting: Cardiology

## 2024-05-19 VITALS — BP 112/68 | HR 58 | Ht 66.0 in | Wt 158.0 lb

## 2024-05-19 DIAGNOSIS — Z8249 Family history of ischemic heart disease and other diseases of the circulatory system: Secondary | ICD-10-CM | POA: Diagnosis not present

## 2024-05-19 DIAGNOSIS — E785 Hyperlipidemia, unspecified: Secondary | ICD-10-CM

## 2024-05-19 DIAGNOSIS — R0609 Other forms of dyspnea: Secondary | ICD-10-CM

## 2024-05-19 NOTE — Progress Notes (Unsigned)
 Cardiology Office Note:    Date:  05/19/2024   ID:  Lindsey Patton, DOB June 08, 1951, MRN [id number]  PCP:  Ina Marcellus RAMAN, MD  Cardiologist:  Lamar Fitch, MD    Referring MD: Ina Marcellus RAMAN, MD   No chief complaint on file.  Nuclear portion of the stress test: Rest images were abnormal showing mild defect basal mid and apical portion of the inferior wall Stress images were abnormal showing moderate defect involving the basal mid and apical portion of the inferior wall Gated images were normal showing normal contractility all segments overall normal left ventricle ejection fraction 62%.  Summary conclusions: 1.  Mild ischemia involving inferior wall 2.  Normal gated images. 3.  Normal ormal ejection fraction.    History of Present Illness:    Lindsey Patton is a 73 y.o. female past medical history significant for dyspnea exertion dyslipidemia calcium score 0 family history of coronary disease.  Comes today 2 months for follow-up.  Overall doing great asymptomatic exercise on the regular basis goes 3 times awake to the gym other days she does different exercises have no difficulty doing it.  Last time she was discovered to have osteoporosis she is trying to do evident what she can to prevent this from progressing and that is why she is aggressively exercising and also getting benefits from cardiovascular point review.  Past Medical History:  Diagnosis Date   Dyslipidemia 11/15/2021   Dyspnea on exertion 11/15/2021   Family history of coronary artery disease 11/15/2021   Osteopenia    hip   Osteoporosis    Spine    Past Surgical History:  Procedure Laterality Date   CATARACT EXTRACTION Bilateral    RETINAL DETACHMENT SURGERY Bilateral    TUBAL LIGATION      Current Medications: Current Meds  Medication Sig   calcium citrate (CALCITRATE - DOSED IN MG ELEMENTAL CALCIUM) 950 (200 Ca) MG tablet Take 200 mg of elemental calcium by mouth daily.   ezetimibe (ZETIA) 10 MG  tablet Take 10 mg by mouth daily.   OVER THE COUNTER MEDICATION Take 1 tablet by mouth daily. [employer] [facility] Collagen Peptide   OVER THE COUNTER MEDICATION Take 1 tablet by mouth daily. Natural Option OsteOrgoni Cal Plus CK2 D3 Magnesium, Calcium Magnesium     Allergies:   Patient has no known allergies.   Social History   Socioeconomic History   Marital status: Married    Spouse name: Not on file   Number of children: Not on file   Years of education: Not on file   Highest education level: Not on file  Occupational History   Not on file  Tobacco Use   Smoking status: Never   Smokeless tobacco: Never  Substance and Sexual Activity   Alcohol use: Never   Drug use: Never   Sexual activity: Not Currently  Other Topics Concern   Not on file  Social History Narrative   Not on file   Social Drivers of Health   Financial Resource Strain: Not on file  Food Insecurity: Not on file  Transportation Needs: Not on file  Physical Activity: Not on file  Stress: Not on file  Social Connections: Not on file     Family History: The patient's family history includes Heart failure in her maternal grandfather and mother. ROS:   Please see the history of present illness.    All 14 point review of systems negative except as described per history of present illness  EKGs/Labs/Other Studies Reviewed:    EKG Interpretation Date/Time:  Tuesday May 19 2024 13:26:30 EDT Ventricular Rate:  58 PR Interval:  180 QRS Duration:  70 QT Interval:  406 QTC Calculation: 398 R Axis:   27  Text Interpretation: Sinus bradycardia When compared with ECG of 23-May-2023 13:23, No significant change was found Confirmed by Bernie [place] [id number]) on 05/19/2024 1:30:51 PM    Recent Labs: No results found for requested labs within last 365 days.  Recent Lipid Panel No results found for: CHOL, TRIG, HDL, CHOLHDL, VLDL, LDLCALC, LDLDIRECT  Physical Exam:    VS:  BP 112/68    Pulse (!) 58   Ht 5' 6 (1.676 m)   Wt 158 lb (71.7 kg)   SpO2 98%   BMI 25.50 kg/m     Wt Readings from Last 3 Encounters:  05/19/24 158 lb (71.7 kg)  05/23/23 154 lb 6.4 oz (70 kg)  05/28/22 152 lb 6.4 oz (69.1 kg)     GEN:  Well nourished, well developed in no acute distress HEENT: Normal NECK: No JVD; No carotid bruits LYMPHATICS: No lymphadenopathy CARDIAC: RRR, no murmurs, no rubs, no gallops RESPIRATORY:  Clear to auscultation without rales, wheezing or rhonchi  ABDOMEN: Soft, non-tender, non-distended MUSCULOSKELETAL:  No edema; No deformity  SKIN: Warm and dry LOWER EXTREMITIES: no swelling NEUROLOGIC:  Alert and oriented x 3 PSYCHIATRIC:  Normal affect   ASSESSMENT:    1. Dyspnea on exertion   2. Dyslipidemia   3. Family history of coronary artery disease    PLAN:    In order of problems listed above:  [place] exertion denies having any doing well. Dyslipidemia total cholesterol 213 HDL 45 she takes only Zetia she does not want to do any more medications but with her ability to exercise so well exercising aggressively and calcium score 0 I recommend to continue doing what she is doing and we will continue discussion about potentially using some statins. Family history of coronary artery disease.  No other contributing factor   Medication Adjustments/Labs and Tests Ordered: Current medicines are reviewed at length with the patient today.  Concerns regarding medicines are outlined above.  Orders Placed This Encounter  Procedures   EKG 12-Lead   Medication changes: No orders of the defined types were placed in this encounter.   Signed, [place] DOROTHA Bernie, MD, [facility] 05/19/2024 1:48 PM    [facility]

## 2024-05-19 NOTE — Patient Instructions (Signed)
